# Patient Record
Sex: Male | Born: 1995 | Race: Black or African American | Hispanic: No | Marital: Single | Smoking: Current some day smoker
Health system: Southern US, Community
[De-identification: ages and names within clinical notes are randomized; demographics above are authoritative.]

---

## 2013-09-10 ENCOUNTER — Emergency Department (HOSPITAL_COMMUNITY)
Admission: EM | Admit: 2013-09-10 | Discharge: 2013-09-10 | Disposition: A | Discharge status: Home / Self Care | Payer: 59 | Attending: Emergency Medicine | Admitting: Emergency Medicine

## 2013-09-10 ENCOUNTER — Encounter (HOSPITAL_COMMUNITY): Payer: Self-pay | Admitting: Emergency Medicine

## 2013-09-10 ENCOUNTER — Emergency Department (HOSPITAL_COMMUNITY): Payer: 59

## 2013-09-10 DIAGNOSIS — F17200 Nicotine dependence, unspecified, uncomplicated: Secondary | ICD-10-CM | POA: Insufficient documentation

## 2013-09-10 DIAGNOSIS — J069 Acute upper respiratory infection, unspecified: Secondary | ICD-10-CM

## 2013-09-10 MED ORDER — ACETAMINOPHEN 325 MG PO TABS
650.0000 mg | ORAL_TABLET | Freq: Once | ORAL | Status: AC
  Administered 2013-09-10: 650 mg via ORAL
  Filled 2013-09-10: qty 2

## 2013-09-10 MED ORDER — GUAIFENESIN-DM 100-10 MG/5ML PO SYRP: 5.0000 mL | ORAL_SOLUTION | ORAL | Status: DC | PRN

## 2013-09-10 MED ORDER — ALBUTEROL SULFATE HFA 108 (90 BASE) MCG/ACT IN AERS
2.0000 | INHALATION_SPRAY | Freq: Once | RESPIRATORY_TRACT | Status: AC
  Administered 2013-09-10: 2 via RESPIRATORY_TRACT
  Filled 2013-09-10: qty 6.7

## 2013-09-10 NOTE — ED Notes (Signed)
Patient states has had nasal and chest congestion for a week and 1/2.   Patient states he started out with sore throat, but it has resolved.

## 2013-09-10 NOTE — ED Provider Notes (Signed)
History/physical exam/procedure(s) were performed by non-physician practitioner and as supervising physician I was immediately available for consultation/collaboration. I have reviewed all notes and am in agreement with care and plan.   Hilario Quarryanielle S Elainna Eshleman, MD 09/10/13 [phone]

## 2013-09-10 NOTE — Discharge Instructions (Signed)
Take 2 puffs every 4-6 hours Use cough medication as needed  Drink plenty of fluids and rest  Return to the emergency department if you develop any changing/worsening condition, repeated vomiting, fever, difficulty breathing, coughing up blood, or any other concerns (please read additional information regarding your condition below)'  Cough, Adult  A cough is a reflex that helps clear your throat and airways. It can help heal the body or may be a reaction to an irritated airway. A cough may only last 2 or 3 weeks (acute) or may last more than 8 weeks (chronic).  CAUSES Acute cough:  Viral or bacterial infections. Chronic cough:  Infections.  Allergies.  Asthma.  Post-nasal drip.  Smoking.  Heartburn or acid reflux.  Some medicines.  Chronic lung problems (COPD).  Cancer. SYMPTOMS   Cough.  Fever.  Chest pain.  Increased breathing rate.  High-pitched whistling sound when breathing (wheezing).  Colored mucus that you cough up (sputum). TREATMENT   A bacterial cough may be treated with antibiotic medicine.  A viral cough must run its course and will not respond to antibiotics.  Your caregiver may recommend other treatments if you have a chronic cough. HOME CARE INSTRUCTIONS   Only take over-the-counter or prescription medicines for pain, discomfort, or fever as directed by your caregiver. Use cough suppressants only as directed by your caregiver.  Use a cold steam vaporizer or humidifier in your bedroom or home to help loosen secretions.  Sleep in a semi-upright position if your cough is worse at night.  Rest as needed.  Stop smoking if you smoke. SEEK IMMEDIATE MEDICAL CARE IF:   You have pus in your sputum.  Your cough starts to worsen.  You cannot control your cough with suppressants and are losing sleep.  You begin coughing up blood.  You have difficulty breathing.  You develop pain which is getting worse or is uncontrolled with  medicine.  You have a fever. MAKE SURE YOU:   Understand these instructions.  Will watch your condition.  Will get help right away if you are not doing well or get worse. Document Released: 12/11/2010 Document Revised: 09/06/2011 Document Reviewed: 12/11/2010 [facility] Patient Information 2014 [place], [place].  Upper Respiratory Infection, Adult An upper respiratory infection (URI) is also known as the common cold. It is often caused by a type of germ (virus). Colds are easily spread (contagious). You can pass it to others by kissing, coughing, sneezing, or drinking out of the same glass. Usually, you get better in 1 or 2 weeks.  HOME CARE   Only take medicine as told by your doctor.  Use a warm mist humidifier or breathe in steam from a hot shower.  Drink enough water and fluids to keep your pee (urine) clear or pale yellow.  Get plenty of rest.  Return to work when your temperature is back to normal or as told by your doctor. You may use a face mask and wash your hands to stop your cold from spreading. GET HELP RIGHT AWAY IF:   After the first few days, you feel you are getting worse.  You have questions about your medicine.  You have chills, shortness of breath, or brown or red spit (mucus).  You have yellow or brown snot (nasal discharge) or pain in the face, especially when you bend forward.  You have a fever, puffy (swollen) neck, pain when you swallow, or white spots in the back of your throat.  You have a bad headache, ear  pain, sinus pain, or chest pain.  You have a high-pitched whistling sound when you breathe in and out (wheezing).  You have a lasting cough or cough up blood.  You have sore muscles or a stiff neck. MAKE SURE YOU:   Understand these instructions.  Will watch your condition.  Will get help right away if you are not doing well or get worse. Document Released: 12/01/2007 Document Revised: 09/06/2011 Document Reviewed: 10/19/2010 [facility]  Patient Information 2014 [place], [place].   Emergency Department Resource Guide 1) Find a Doctor and Pay Out of Pocket Although you won't have to find out who is covered by your insurance plan, it is a good idea to ask around and get recommendations. You will then need to call the office and see if the doctor you have chosen will accept you as a new patient and what types of options they offer for patients who are self-pay. Some doctors offer discounts or will set up payment plans for their patients who do not have insurance, but you will need to ask so you aren't surprised when you get to your appointment.  2) Contact Your Local Health Department Not all health departments have doctors that can see patients for sick visits, but many do, so it is worth a call to see if yours does. If you don't know where your local health department is, you can check in your phone book. The CDC also has a tool to help you locate your state's health department, and many state websites also have listings of all of their local health departments.  3) Find a Walk-in Clinic If your illness is not likely to be very severe or complicated, you may want to try a walk in clinic. These are popping up all over the country in pharmacies, drugstores, and shopping centers. They're usually staffed by nurse practitioners or physician assistants that have been trained to treat common illnesses and complaints. They're usually fairly quick and inexpensive. However, if you have serious medical issues or chronic medical problems, these are probably not your best option.  No Primary Care Doctor: - Call Health Connect at  [phone] - they can help you locate a primary care doctor that  accepts your insurance, provides certain services, etc. - Physician Referral Service- [phone]  Chronic Pain Problems: Organization         Address  Phone   Notes  [facility]  [phone] Patients need to be referred by their  primary care doctor.   Medication Assistance: Organization         Address  Phone   Notes  [facility] Medication [facility] [address] [place] [place], [place] [address] [phone] --Must be a resident of [facility] -- Must have NO insurance coverage whatsoever (no Medicaid/ Medicare, etc.) -- The pt. MUST have a primary care doctor that directs their care regularly and follows them in the community   Med[facility]ist  [phone]   [employer]  [phone]    Agencies that provide inexpensive medical care: Organization         Address  Phone   Notes  [facility]  [phone]   [facility]    [phone]   [facility] [address] [place], [place] [address] [phone]   [facility] [address], [place] [phone]   Planned Parenthood    [phone]   [facility]    [phone]   [facility]  [address], [place] Phone:  [phone], Fax:  [phone] Hours of Operation:  9 am - 6 pm, M-F.  Also accepts Medicaid/Medicare and self-pay.  [facility]  [address], [place] Phone: [phone]) [phone], Fax: [phone]. Hours of Operation:  8:30 am - 5:30 pm, M-F.  Also accepts Medicaid and self-pay.  [facility] [place] [address], [place] Phone: [phone](336) [phone]   [facility] [address], [place], [place] [phone], Ext. [phone] Mondays & Thursdays: 7-9 AM.  First 15 patients are seen on a first come, first serve basis.    Medicaid-accepting [facility] Providers:  Organization         Address  Phone   Notes  [facility] [address], [place] [phone] Also accepts self-pay patients.  [facility] [address], [place]  [phone]) [phone]   [facility] [address], [place] [phone]   [facility] [address] [place] [address], [place] [phone]) [phone]   Renaye RakersVeita Bland [address], [place]   [phone] Only accepts [place] Access [place] patients after they have their name applied to their card.   Self-Pay (no insurance) in [facility]:  Organization         Address  Phone   Notes  Sickle Cell Patients, [facility] [address], [place] [phone]) [phone]   [facility] [address], [place] [phone]   [facility]  [address], [place] [phone]   [facility]/Dr. Osei-Bonsu  [address] [place] [address], [place] or [phone], [place] [phone] Phone number for both [place] and [place] locations is the same.  [facility] [address], [place] [phone]   [facility] [address] [place] [address], [place] or [address] [phone]) [phone]   [facility] [address], [place] [phone], phone; [phone]) [phone], fax Sees patients 1st and 3rd Saturday of every month.  Must not qualify for public or private insurance (i.e. Medicaid, Medicare, [place] Health Choice, Veterans' Benefits)  Household income should be no more than 200% of the poverty level The clinic cannot treat you if you are pregnant or think you are pregnant  Sexually transmitted diseases are not treated at the clinic.    Dental Care: Organization         Address  Phone  Notes  [facility] Department of [facility] [address], [place] [phone] Accepts children up to age 20 who are enrolled in [place] or [place] Health Choice; pregnant women with a Medicaid card; and children who have applied for Medicaid or [place] Health Choice, but were declined, whose parents can pay a reduced fee  at time of service.  [facility] of [facility] [place]  [address], [place] [phone] Accepts children up to age 20 who are enrolled in [place] or [place] Health Choice; pregnant women with a Medicaid card; and children who have applied for Medicaid or [place] Health Choice, but were declined, whose parents can pay a reduced fee at time of  service.  [facility]  [address] [place], [place] [phone] Patients are seen by appointment only. Walk-ins are not accepted. [facility] will see patients 36 years of age and older. Monday - Tuesday (8am-5pm) Most Wednesdays (8:30-5pm) $30 per visit, cash only  [facility] Access [facility]  [address], [facility] [phone] Patients are seen by appointment only. Walk-ins are not accepted. [facility] will see patients 58 years of age and older. One Wednesday Evening (Monthly: Volunteer Based).  $30 per visit, cash only  [employer]  [phone] for adults; Children under age 40, call Graduate Pediatric Dentistry at [phone]. Children aged 69-14, please call [phone] to request a pediatric application.  Dental services are provided in all areas of dental care including fillings, crowns and bridges, complete and partial dentures, implants, gum treatment, root canals, and extractions. Preventive care is also provided. Treatment is provided to both adults and children. Patients are selected via a lottery and there is often a waiting list.   [facility] [address], [place]  [phone] [email or web address]   [facility] [address] [place], [place] [phone], Ext. [phone] Second and Fourth Thursday of each month, opens at 6:30 AM; Clinic ends at 9 AM.  Patients are seen on a first-come first-served basis, and a limited number are seen during each clinic.   [facility]  [address] [place], [place] [phone]   Eligibility Requirements You must have lived in [place], [place], or [place] counties for at least the last three months.   You cannot be eligible for state or federal sponsored [place], including [employer], [place], or [employer].   You generally cannot be eligible for healthcare insurance through your employer.    How to apply: Eligibility screenings are held every Tuesday and Wednesday afternoon from 1:00 pm until 4:00 pm. You do not need an appointment for the interview!  [facility] [address] [place], [place] [phone]   [facility] Health Department  [phone]   [facility] Health Department  [phone]   [facility] Health Department  [phone]    Behavioral Health Resources in the Community: Intensive Outpatient Programs Organization         Address  Phone  Notes  [facility] [address], [place], [place] [phone]   [facility] [address], [place], [place] [phone]   [facility]: [facility] Drug [facility] [address], [place], [place]  [phone]   [facility] Mental Health [address]  [place], [place] [phone] or [phone]   Substance Abuse Resources Organization         Address  Phone  Notes  Alcohol [facility] Drug Services  [phone]   [facility]  [phone]   [facility] [place]  [phone]   Floydene Flock  [phone]   Residential & Outpatient Substance Abuse Program  [phone]   Psychological Services Organization         Address  Phone  Notes  [facility]  [phone]   [facility]  [phone]   [facility] [address], [place] [phone] or [phone]    Mobile Crisis Teams Organization         Address  Phone  Notes  [facility], Mobile Crisis Care Unit  [phone]   Assertive Psychotherapeutic  Services  [address] [place], [place] [phone]   [facility] [address] [place] [phone]    Self-Help/Support Groups Organization         Address  Phone             Notes  [facility] [place] - variety of support groups  [phone]- [phone] Call for more information  Narcotics Anonymous (NA), [facility] [address], [employer] [place]  2 meetings at this location   Statistician         Address  Phone  Notes  [facility] [address],    [place]  [phone]   [facility]  [address] [place] [address], [place], [place] [phone]   [facility] [address] [place], [place] [phone] Admissions: 8am-3pm M-F  [facility] [address],    [place], [place] [phone]   [facility] [address] [place], [place], [place] [phone]   [facility] [address]  [place], [place] [phone]   [facility] - Intensive Outpatient [address], [place], [place] [phone]   [facility] ([facility] [address]  [place], [place] [phone] or [phone]   [facility] (RT[facility]) [address] [place], [place] [phone] Accepts Medicaid  [facility] [address],  [place] [phone] Substance Abuse/Addiction Treatment   [facility] Organization         Address  Phone  Notes  [facility]  [phone]   Angie Fava, PhD [address]vin Kn[address] [place], [place]   [phone] or [phone]   [facility]   [address] [place], [place] [phone]   [facility] [address], [place], [place] [phone] Insurance/Medicaid/sponsorship through [facility] [address]                                    [place], [place] [phone] Therapy/tele-psych/case  [facility] [address], [place] [phone]    Dr. Lolly Mustache  [phone]   [facility]  [employer] [facility] Dept. 1) [address] [place] 2) [address], [place] 3)  [address], [place] [phone]  [phone]   [facility] Child Abuse Hotline (551) 620-2872 or [phone] (After Hours)

## 2013-09-10 NOTE — ED Provider Notes (Signed)
CSN: [id number]     Arrival date & time 09/10/13  3086 History  This chart was scribed for non-physician practitioner, Coral Ceo, PA-C working with Hilario Quarry, MD by Greggory Stallion, ED scribe. This patient was seen in room TR07C/TR07C and the patient's care was started at 9:12 AM.   Chief Complaint  Patient presents with  . Nasal Congestion  . chest congestion    The history is provided by the patient. No language interpreter was used.   HPI Comments: Douglas Rhodes is a 18 y.o. male who presents to the Emergency Department complaining of nasal congestion, rhinorrhea, sinus pressure, chest congestion and productive cough of light green sputum that started 1.5 weeks ago. He states his symptoms started with a sore throat but it has resolved. Pt has taken mucinex, sudafed and benadryl with little relief. Pt now has a slight headache but denies any other pain. Denies fever, hemoptysis, wheezing, SOB, chest pain, abdominal pain, nausea, emesis, diarrhea. Denies sick contacts. Pt smokes cigarettes sometimes. Denies any PMH.   No past medical history on file. No past surgical history on file. No family history on file. History  Substance Use Topics  . Smoking status: Not on file  . Smokeless tobacco: Not on file  . Alcohol Use: Not on file    Review of Systems  Constitutional: Negative for fever.  HENT: Positive for congestion, rhinorrhea and sinus pressure. Negative for sore throat.   Respiratory: Positive for cough. Negative for shortness of breath and wheezing.   Cardiovascular: Negative for chest pain.  Gastrointestinal: Negative for nausea, vomiting, abdominal pain and diarrhea.  Neurological: Positive for headaches.  All other systems reviewed and are negative.   Allergies  Review of patient's allergies indicates not on file.  Home Medications  No current outpatient prescriptions on file.  BP 120/74  Pulse 93  Temp(Src) 98.1 F (36.7 C) (Oral)  Resp 18  Ht 6\' 1"   (1.854 m)  Wt 156 lb (70.761 kg)  BMI 20.59 kg/m2  SpO2 100%  Filed Vitals:   09/10/13 0846  BP: 120/74  Pulse: 93  Temp: 98.1 F (36.7 C)  TempSrc: Oral  Resp: 18  Height: 6\' 1"  (1.854 m)  Weight: 156 lb (70.761 kg)  SpO2: 100%     Physical Exam  Nursing note and vitals reviewed. Constitutional: He is oriented to person, place, and time. He appears well-developed and well-nourished. No distress.  HENT:  Head: Normocephalic and atraumatic.  Right Ear: Tympanic membrane, external ear and ear canal normal.  Left Ear: Tympanic membrane, external ear and ear canal normal.  Nose: Nose normal.  Mouth/Throat: Oropharynx is clear and moist. No oropharyngeal exudate.  Tympanic membranes gray and translucent bilaterally with no erythema, edema, or hemotympanum.  No mastoid or tragal tenderness bilaterally. No erythema to the posterior pharynx. Tonsils without edema or exudates. Uvula midline. No trismus. No difficulty controlling secretions. No sinus tenderness throughout.   Eyes: Conjunctivae and EOM are normal. Pupils are equal, round, and reactive to light. Right eye exhibits no discharge. Left eye exhibits no discharge.  Neck: Normal range of motion. Neck supple. No tracheal deviation present.  No cervical spinal or paraspinal tenderness to palpation throughout.  No limitations with neck ROM.  No cervical lymphadenopathy. No nuchal rigidity.   Cardiovascular: Normal rate, regular rhythm and normal heart sounds.  Exam reveals no gallop and no friction rub.   No murmur heard. Pulmonary/Chest: Effort normal. No respiratory distress. He has wheezes. He has no rhonchi.  He has no rales. He exhibits no tenderness.  Intermittent expiratory wheeze throughout  Abdominal: Soft. He exhibits no distension. There is no tenderness.  Musculoskeletal: Normal range of motion. He exhibits no edema and no tenderness.  No LE edema or calf tenderness bilaterally  Neurological: He is alert and oriented to  person, place, and time.  Skin: Skin is warm and dry. He is not diaphoretic.  Psychiatric: He has a normal mood and affect. His behavior is normal.    ED Course  Procedures (including critical care time)  DIAGNOSTIC STUDIES: Oxygen Saturation is 100% on RA, normal by my interpretation.    COORDINATION OF CARE: 9:15 AM-Discussed treatment plan which includes chest xray and tylenol with pt at bedside and pt agreed to plan.   Labs Review Labs Reviewed - No data to display Imaging Review Dg Chest 2 View  09/10/2013   CLINICAL DATA:  Congestion, history of tobacco use  EXAM: CHEST  2 VIEW  COMPARISON:  None.  FINDINGS: The lungs are mildly hyperinflated with hemidiaphragm flattening. There is no focal infiltrate. There is no pleural effusion. The mediastinum is normal in width. The cardiac silhouette is normal in size. The pulmonary vascularity is not engorged. The trachea is midline. There is no pneumothorax or pleural effusion. The observed portions of the bony thorax appear normal.  IMPRESSION: There is hyperinflation consistent with reactive airway disease. There is no evidence of pneumonia.   Electronically Signed   By: David  SwazilandJordan   On: 09/10/2013 09:36     EKG Interpretation None          DG Chest 2 View (Final result)  Result time: 09/10/13 09:36:16    Final result by Rad Results In Interface (09/10/13 09:36:16)    Narrative:   CLINICAL DATA: Congestion, history of tobacco use  EXAM: CHEST 2 VIEW  COMPARISON: None.  FINDINGS: The lungs are mildly hyperinflated with hemidiaphragm flattening. There is no focal infiltrate. There is no pleural effusion. The mediastinum is normal in width. The cardiac silhouette is normal in size. The pulmonary vascularity is not engorged. The trachea is midline. There is no pneumothorax or pleural effusion. The observed portions of the bony thorax appear normal.  IMPRESSION: There is hyperinflation consistent with reactive airway  disease. There is no evidence of pneumonia.   Electronically Signed By: David SwazilandJordan On: 09/10/2013 09:36          MDM   Natale Milcharon Pion is a 18 y.o. male ale who presents to the Emergency Department complaining of nasal congestion, rhinorrhea, sinus pressure, chest congestion and productive cough of light green sputum that started 1.5 weeks ago.   Rechecks  10:00 AM = Patient states he feels better after his albuterol treatment. Lungs clear to auscultation. Headache starting to improve after Tylenol. Asking for work excuse.     Etiology of nasal and chest congestion possibly due to URI vs bronchitis vs viral syndrome. Chest x-ray negative for an acute cardiopulmonary process. Patient had an intermittent wheeze which cleared with one albuterol treatment in the ED. No hypoxia, respiratory distress, or tachypnea. No chest pain, SOB, or dyspnea. Patient encouraged to quit smoking. Patient also provided resources for follow-up with a PCP. Encouraged to rest and drink fluids. Return precautions, discharge instructions, and follow-up was discussed with the patient before discharge.     Discharge Medication List as of 09/10/2013  9:54 AM    START taking these medications   Details  guaiFENesin-dextromethorphan (ROBITUSSIN DM) 100-10 MG/5ML syrup Take  5 mLs by mouth every 4 (four) hours as needed for cough., Starting 09/10/2013, Until Discontinued, Print         Final impressions: 1. URI (upper respiratory infection)       Thomasenia Sales   I personally performed the services described in this documentation, which was scribed in my presence. The recorded information has been reviewed and is accurate.   Jillyn Ledger, PA-C 09/10/13 1024

## 2016-01-15 ENCOUNTER — Encounter (HOSPITAL_COMMUNITY): Payer: Self-pay | Admitting: Emergency Medicine

## 2016-01-15 ENCOUNTER — Emergency Department (HOSPITAL_COMMUNITY)
Admission: EM | Admit: 2016-01-15 | Discharge: 2016-01-15 | Disposition: A | Discharge status: Home / Self Care | Payer: BLUE CROSS/BLUE SHIELD | Attending: Emergency Medicine | Admitting: Emergency Medicine

## 2016-01-15 DIAGNOSIS — R52 Pain, unspecified: Secondary | ICD-10-CM | POA: Diagnosis present

## 2016-01-15 DIAGNOSIS — F17200 Nicotine dependence, unspecified, uncomplicated: Secondary | ICD-10-CM | POA: Insufficient documentation

## 2016-01-15 MED ORDER — IBUPROFEN 400 MG PO TABS
600.0000 mg | ORAL_TABLET | Freq: Once | ORAL | Status: AC
  Administered 2016-01-15: 600 mg via ORAL
  Filled 2016-01-15: qty 1

## 2016-01-15 MED ORDER — IBUPROFEN 600 MG PO TABS: 600.0000 mg | ORAL_TABLET | Freq: Four times a day (QID) | ORAL | Status: DC | PRN

## 2016-01-15 NOTE — ED Provider Notes (Signed)
CSN: [id number]     Arrival date & time 01/15/16  9147 History   First MD Initiated Contact with Patient 01/15/16 [phone]     Chief Complaint  Patient presents with  . Generalized Body Aches   HPI  Douglas Rhodes is an 20 y.o. male with no significant PMH who presents to the ED for evaluation of generalized body aches. He states his symptoms have been presents for the past three weeks. He states he aches intermittently and sometimes it's in his shoulders, in his back, and in his legs. He states he feels the most sore after waking up from sleep. He denies injury or trauma. Denies numbness, weakness, or tingling. He has not tried anything to alleviate his symptoms. Denies fam hx of any conditions. Denies chest pain or SOB. Denies URI symptoms. He does note he has been feeling very drained lately. He started summer courses seven weeks ago in addition to working a physically demanding job with [employer].  History reviewed. No pertinent past medical history. History reviewed. No pertinent past surgical history. History reviewed. No pertinent family history. Social History  Substance Use Topics  . Smoking status: Current Some Day Smoker  . Smokeless tobacco: None  . Alcohol Use: Yes    Review of Systems  All other systems reviewed and are negative.     Allergies  Shrimp  Home Medications   Prior to Admission medications   Medication Sig Start Date End Date Taking? Authorizing Provider  diphenhydrAMINE (BENADRYL) 25 MG tablet Take 50 mg by mouth at bedtime as needed for allergies.    Historical Provider, MD  ferrous sulfate 325 (65 FE) MG tablet Take 325 mg by mouth daily with breakfast.    Historical Provider, MD  guaiFENesin-dextromethorphan (ROBITUSSIN DM) 100-10 MG/5ML syrup Take 5 mLs by mouth every 4 (four) hours as needed for cough. 09/10/13   Jillyn Ledger, PA-C  Multiple Vitamin (MULTIVITAMIN WITH MINERALS) TABS tablet Take 1 tablet by mouth daily.    Historical Provider, MD   BP  118/79 mmHg  Pulse 72  Temp(Src) 98.4 F (36.9 C) (Oral)  Resp 18  SpO2 99% Physical Exam  Constitutional: He is oriented to person, place, and time. No distress.  HENT:  Head: Atraumatic.  Right Ear: External ear normal.  Left Ear: External ear normal.  Nose: Nose normal.  Eyes: Conjunctivae are normal. No scleral icterus.  Cardiovascular: Normal rate and regular rhythm.   Pulmonary/Chest: Effort normal. No respiratory distress.  Abdominal: He exhibits no distension.  Musculoskeletal:  No c-spine, t-spine, or l-spine tenderness. No stepoff or deformity. No upper or lower extremity tenderness. Moves all extremities freely. Strength intact throughout. Steady gait.  Neurological: He is alert and oriented to person, place, and time.  Skin: Skin is warm and dry. He is not diaphoretic.  Psychiatric: He has a normal mood and affect. His behavior is normal.  Nursing note and vitals reviewed.   ED Course  Procedures (including critical care time) Labs Review Labs Reviewed - No data to display  Imaging Review No results found. I have personally reviewed and evaluated these images and lab results as part of my medical decision-making.   EKG Interpretation None      MDM   Final diagnoses:  Body aches    Exam is reassuring. Pt is well appearing. VS unremarkable. No neuro deficits. No indication for further emergent workup or imaging at this time. Will d/c home with ibuprofen. [facility] and resources  given to establish PCP for follow up. ER return precautions given.    Carlene CoriaSerena Y Cyanne Delmar, PA-C 01/15/16 1026  Maia PlanJoshua G Long, MD 01/16/16 [phone]

## 2016-01-15 NOTE — ED Notes (Signed)
Patient states achy joints x 2 weeks.  "It moves around.  Sometimes its my shoulders, sometimes my knees".   Patient states hasn't taken anything for pain.

## 2016-01-15 NOTE — ED Notes (Signed)
Pt sts generalized body aches in legs and shoulders x 2 weeks

## 2016-01-15 NOTE — Discharge Instructions (Signed)

## 2019-05-14 ENCOUNTER — Emergency Department (HOSPITAL_COMMUNITY)
Admission: EM | Admit: 2019-05-14 | Discharge: 2019-05-14 | Disposition: A | Discharge status: Home / Self Care | Payer: BLUE CROSS/BLUE SHIELD | Attending: Emergency Medicine | Admitting: Emergency Medicine

## 2019-05-14 ENCOUNTER — Other Ambulatory Visit: Payer: Self-pay

## 2019-05-14 ENCOUNTER — Emergency Department (HOSPITAL_COMMUNITY): Payer: BLUE CROSS/BLUE SHIELD

## 2019-05-14 ENCOUNTER — Encounter (HOSPITAL_COMMUNITY): Payer: Self-pay

## 2019-05-14 DIAGNOSIS — F17210 Nicotine dependence, cigarettes, uncomplicated: Secondary | ICD-10-CM | POA: Diagnosis not present

## 2019-05-14 DIAGNOSIS — R0789 Other chest pain: Secondary | ICD-10-CM | POA: Diagnosis present

## 2019-05-14 LAB — BASIC METABOLIC PANEL
Anion gap: 10 (ref 5–15)
BUN: 6 mg/dL (ref 6–20)
CO2: 26 mmol/L (ref 22–32)
Calcium: 9.3 mg/dL (ref 8.9–10.3)
Chloride: 103 mmol/L (ref 98–111)
Creatinine, Ser: 0.94 mg/dL (ref 0.61–1.24)
GFR calc Af Amer: >60 mL/min (ref >60)
GFR calc non Af Amer: >60 mL/min (ref >60)
Glucose, Bld: 87 mg/dL (ref 70–99)
Potassium: 3.7 mmol/L (ref 3.5–5.1)
Sodium: 139 mmol/L (ref 135–145)

## 2019-05-14 LAB — TROPONIN I (HIGH SENSITIVITY)
Troponin I (High Sensitivity): <2 ng/L (ref <18)
Troponin I (High Sensitivity): 2 ng/L (ref <18)

## 2019-05-14 LAB — CBC
HCT: 43.7 % (ref 39.0–52.0)
Hemoglobin: 15.1 g/dL (ref 13.0–17.0)
MCH: 31.9 pg (ref 26.0–34.0)
MCHC: 34.6 g/dL (ref 30.0–36.0)
MCV: 92.2 fL (ref 80.0–100.0)
Platelets: 152 10*3/uL (ref 150–400)
RBC: 4.74 MIL/uL (ref 4.22–5.81)
RDW: 11.9 % (ref 11.5–15.5)
WBC: 4.3 10*3/uL (ref 4.0–10.5)
nRBC: 0 % (ref 0.0–0.2)

## 2019-05-14 MED ORDER — IBUPROFEN 400 MG PO TABS
600.0000 mg | ORAL_TABLET | Freq: Once | ORAL | Status: AC
  Administered 2019-05-14: 600 mg via ORAL
  Filled 2019-05-14: qty 1

## 2019-05-14 MED ORDER — LIDOCAINE 5 % EX PTCH
1.0000 | MEDICATED_PATCH | CUTANEOUS | Status: DC
  Administered 2019-05-14: 1 via TRANSDERMAL
  Filled 2019-05-14: qty 1

## 2019-05-14 NOTE — Discharge Instructions (Signed)
You were evaluated in the emergency department for chest pain.    Your work up was completely normal including heart enzymes, chest x-ray, EKG, labs.    Based on your risk factors, work up and exam you are considered very low risk for major adverse cardiac events in the next 30 days.  This means you can be discharged with close follow up with follow up with primary care doctor for further outpatient work up as needed, if your chest discomfort continues  Call your primary care doctor as soon as possible to establish care and further discussion and work up of your symptoms on an outpatient setting  Take 600 mg ibuprofen every 8 hours for pain.  Apply over the counter lidocaine patch to the area every 12 hours.   Please return to ED if: Your chest pain is worse or on exertion You have a cough that gets worse, or you cough up blood. You have severe pain in chest, back or abdomen. You have chest pain with loss of sensation, weakness or tingling in extremities You have chest pain or shortness of breath with exertion or activity You have sudden, unexplained discomfort in your chest, with radiation arms, back, neck, or jaw. You suddenly have chest pain and begin to sweat, or your skin gets clammy. You feel chest pain with nausea or vomiting, blood in vomit You suddenly feel light-headed or faint. Your heart begins to beat quickly, or it feels like it is skipping beats. You have one sided leg swelling or calf pain You have chest pain with fever, chills, cough or other viral symptoms

## 2019-05-14 NOTE — ED Triage Notes (Signed)
Pt endorses left sided chest pain, no radiation or other sx. VSS.

## 2019-05-14 NOTE — ED Provider Notes (Signed)
[facility] EMERGENCY DEPARTMENT Provider Note   CSN: [id number] Arrival date & time: 05/14/19  1038     History   Chief Complaint Chief Complaint  Patient presents with  . Chest Pain    HPI Douglas Rhodes is a 23 y.o. male with no known past medical history here for evaluation of chest pain.  Onset while he was sitting down last night around 8 PM.  Described as sharp.  Localized to the left chest below nipple area.  Pain is intermittent.  It sometimes hurts when he breathes in and changes positions and moves but not always.  Had slight nausea yesterday with it but resolved after he had a snack.  Smokes marijuana every other day.  No tobacco use.  No recent illnesses.  No recent exercise or lifting activity to cause muscular pain.  No other associate symptoms such as fever, congestion, sore throat, cough, shortness of breath.  No palpitations, lightheadedness or syncope.  No lower extremity edema or calf pain.  No previous history of DVT or PE, recent surgery or immobilization, hormone therapy, cancer.  No personal history of congenital heart disease or CAD.  No family history of CAD.  His grandfather had diabetes.  No interventions.      HPI  History reviewed. No pertinent past medical history.  There are no active problems to display for this patient.   History reviewed. No pertinent surgical history.      Home Medications    Prior to Admission medications   Not on File    Family History History reviewed. No pertinent family history.  Social History Social History   Tobacco Use  . Smoking status: Current Some Day Smoker  Substance Use Topics  . Alcohol use: Yes  . Drug use: Yes    Types: Marijuana     Allergies   Shrimp [shellfish allergy]   Review of Systems Review of Systems  Cardiovascular: Positive for chest pain.  All other systems reviewed and are negative.    Physical Exam Updated Vital Signs BP 126/88 (BP Location: Right  Arm)   Pulse 65   Temp 98.3 F (36.8 C) (Oral)   Resp 14   Ht 6\' 2"  (1.88 m)   Wt 72.6 kg   SpO2 100%   BMI 20.54 kg/m   Physical Exam Constitutional:      Appearance: He is well-developed.     Comments: NAD. Non toxic.   HENT:     Head: Normocephalic and atraumatic.     Nose: Nose normal.  Eyes:     General: Lids are normal.     Conjunctiva/sclera: Conjunctivae normal.  Neck:     Musculoskeletal: Normal range of motion.     Trachea: Trachea normal.     Comments: Trachea midline.  Cardiovascular:     Rate and Rhythm: Normal rate and regular rhythm.     Pulses:          Radial pulses are 2+ on the right side and 2+ on the left side.       Dorsalis pedis pulses are 2+ on the right side and 2+ on the left side.     Heart sounds: Normal heart sounds, S1 normal and S2 normal.     Comments: No LE edema or calf tenderness.  Pulmonary:     Effort: Pulmonary effort is normal.     Breath sounds: Normal breath sounds.  Chest:       Comments: Mild reproducible chest wall tenderness  as above.  Skin is normal in the A/P/lateral thorax without lesions or rash.  No pain noted with deep inspiration during exam. Abdominal:     General: Bowel sounds are normal.     Palpations: Abdomen is soft.     Tenderness: There is no abdominal tenderness.     Comments: No epigastric tenderness. No distention.   Skin:    General: Skin is warm and dry.     Capillary Refill: Capillary refill takes less than 2 seconds.     Comments: No rash to chest wall  Neurological:     Mental Status: He is alert.     GCS: GCS eye subscore is 4. GCS verbal subscore is 5. GCS motor subscore is 6.     Comments: Sensation and strength intact in upper and lower extremities.  Psychiatric:        Speech: Speech normal.        Behavior: Behavior normal.        Thought Content: Thought content normal.      ED Treatments / Results  Labs (all labs ordered are listed, but only abnormal results are displayed) Labs  Reviewed  BASIC METABOLIC PANEL  CBC  TROPONIN I (HIGH SENSITIVITY)  TROPONIN I (HIGH SENSITIVITY)    EKG None  Radiology Dg Chest 2 View  Result Date: 05/14/2019 CLINICAL DATA:  Patient reports sharp stabbing pains under left breast since yesterday. Pressure in chest with inhalation. Denies any cough. Denies any prior heart or lung conditions or surgeries. Patient reports non smoker. EXAM: CHEST - 2 VIEW COMPARISON:  09/10/2013 FINDINGS: The heart size and mediastinal contours are within normal limits. Both lungs are clear. The visualized skeletal structures are unremarkable. IMPRESSION: No active cardiopulmonary disease. Electronically Signed   By: Kathreen Devoid   On: 05/14/2019 12:12    Procedures Procedures (including critical care time)  Medications Ordered in ED Medications  lidocaine (LIDODERM) 5 % 1 patch (1 patch Transdermal Patch Applied 05/14/19 1324)  ibuprofen (ADVIL) tablet 600 mg (600 mg Oral Given 05/14/19 1323)     Initial Impression / Assessment and Plan / ED Course  I have reviewed the triage vital signs and the nursing notes.  Pertinent labs & imaging results that were available during my care of the patient were reviewed by me and considered in my medical decision making (see chart for details).  Pt is a 23 y.o. male presents with what sounds like atypical CP.   CP onset at 8 pm last night while at rest. CP free in ER.  Symptoms described as sharp, non exertional.  Some pain with breathing and position changes.    No associated concerning features such as fever, cough, SOB. No recent illnesses. No palpitations, light-headedness, syncope, pleuritic pain, leg swelling/calf pain to suggest DVT.    No cardiac risk factors.  No h/o HTN, hypercholesterolemia, DM, obesity, smoking, positive family hx, known CAD.    VS WNL and stable. CV and pulmonary exam benign. There is reproducible CP with palpation and position changes.  No LE edema or calf tenderness. No  epigastric tenderness. No neuro or pulse deficits.   Work up benign. EKG non-ischemic.  Hs-trop x 2 undetectable.  No risk factors for PE/DVT, Wells Score is 0 and PERC negative. Heart score is < 3.    Given symptomatology, exam, non ischemic cardiac work up in ER and HEART score patient is appropriate for discharge with PCP f/u.  Work up not suggestive of symptomatic anemia, PE,  PTX, dissection, ACS.  Likely atypical chest pain, possibly MSK etiology vs pleurisy vs costochondritis. No exposures to COVID or symptoms of COVID.  No recent illnesses, rubs, doubt pericarditis. ED return preacutions given. Pt appears reliable for follow up, aware of symptoms that would warrant return to ER.  Pt is comfortable and agreeable with ER POC and discharge plan.    Final Clinical Impressions(s) / ED Diagnoses   Final diagnoses:  Atypical chest pain    ED Discharge Orders    None       Liberty HandyGibbons, Valena Ivanov J, PA-C 05/14/19 1415    Derwood KaplanNanavati, Ankit, MD 05/15/19 [phone]

## 2020-10-19 IMAGING — DX DG CHEST 2V
2 series · 2 of 2 positions shown · non-contrast
Comparison: 09/10/2013

CLINICAL DATA: Patient reports sharp stabbing pains under left
breast since yesterday. Pressure in chest with inhalation. Denies
any cough. Denies any prior heart or lung conditions or surgeries.
Patient reports non smoker.

EXAM:
CHEST - 2 VIEW

[chest pa]
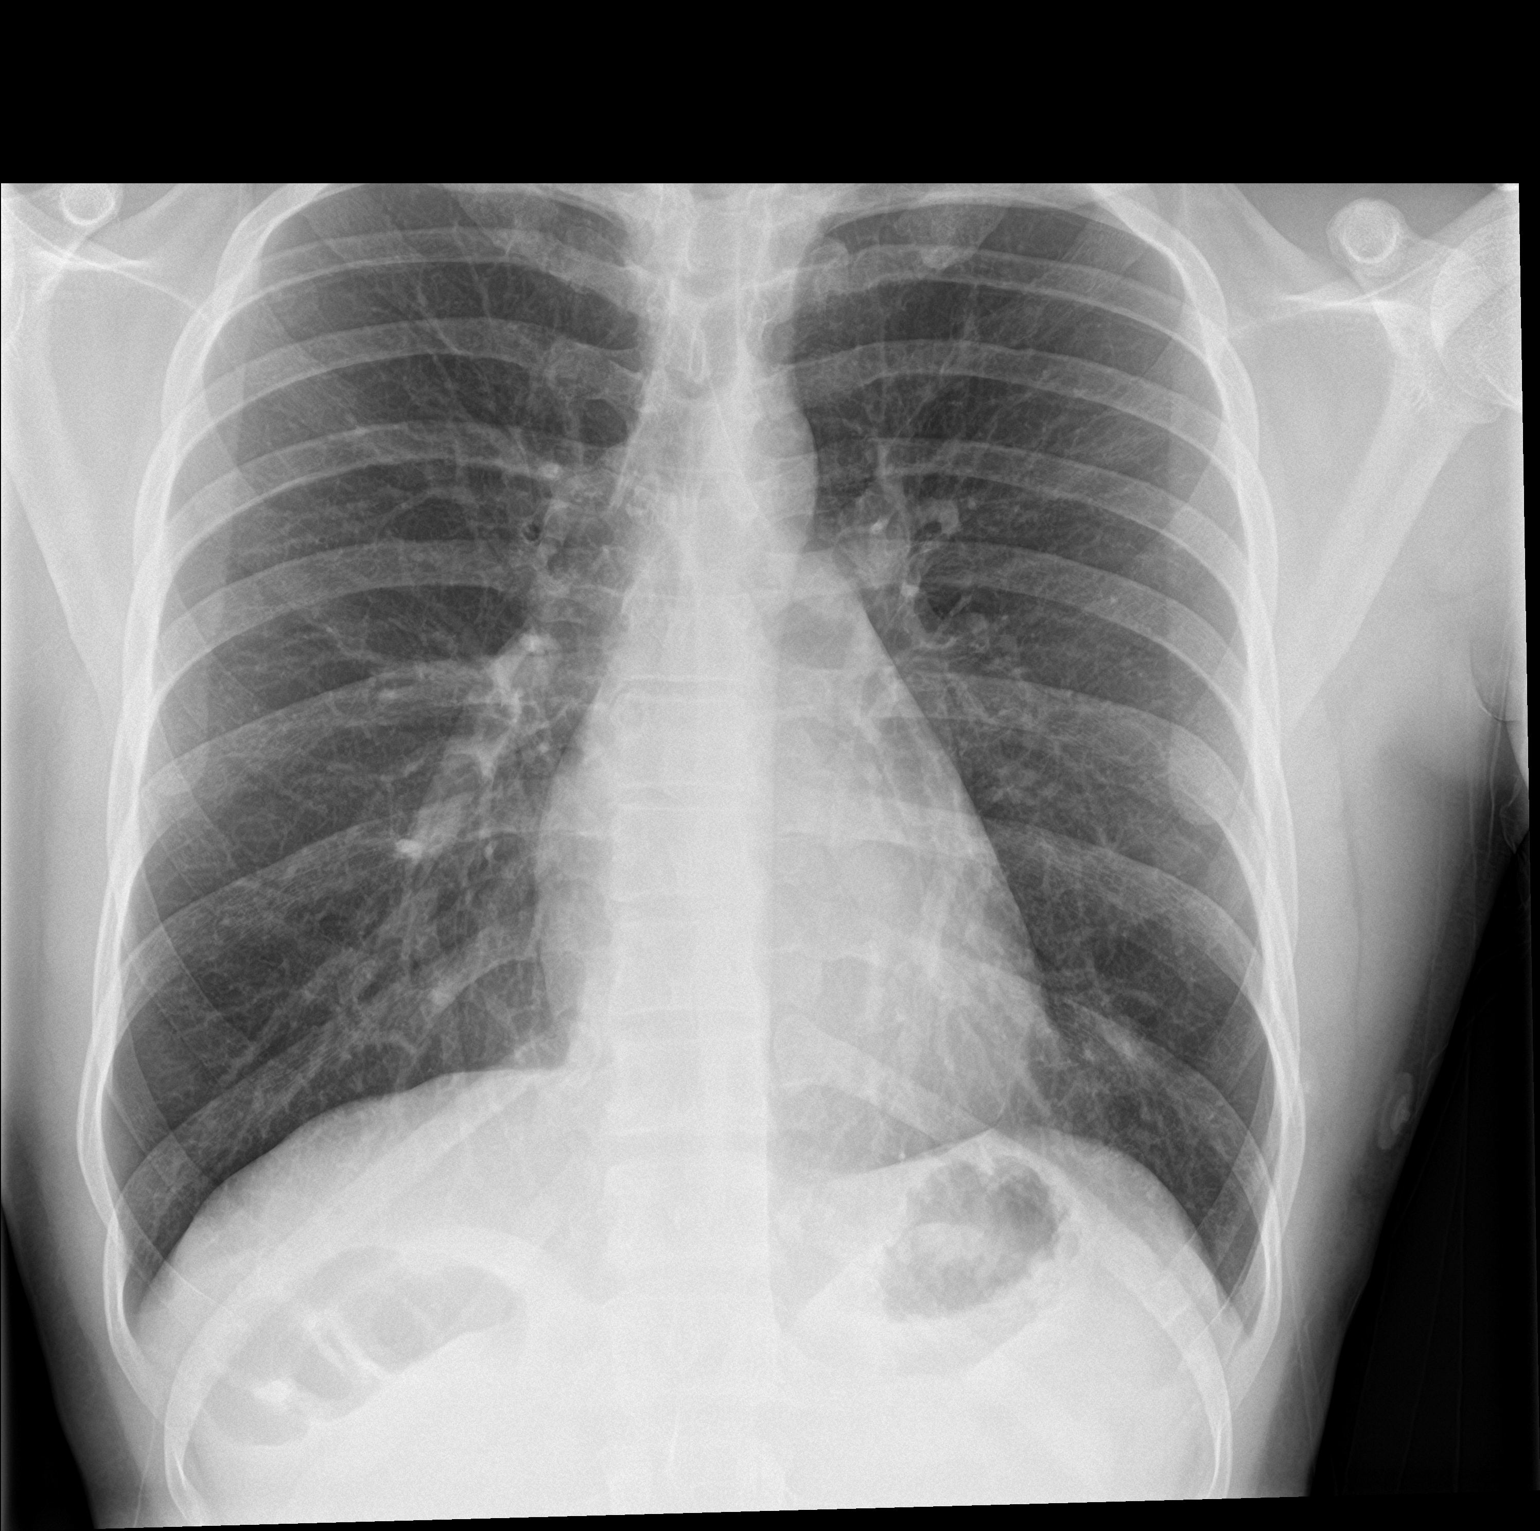

[chest lat]
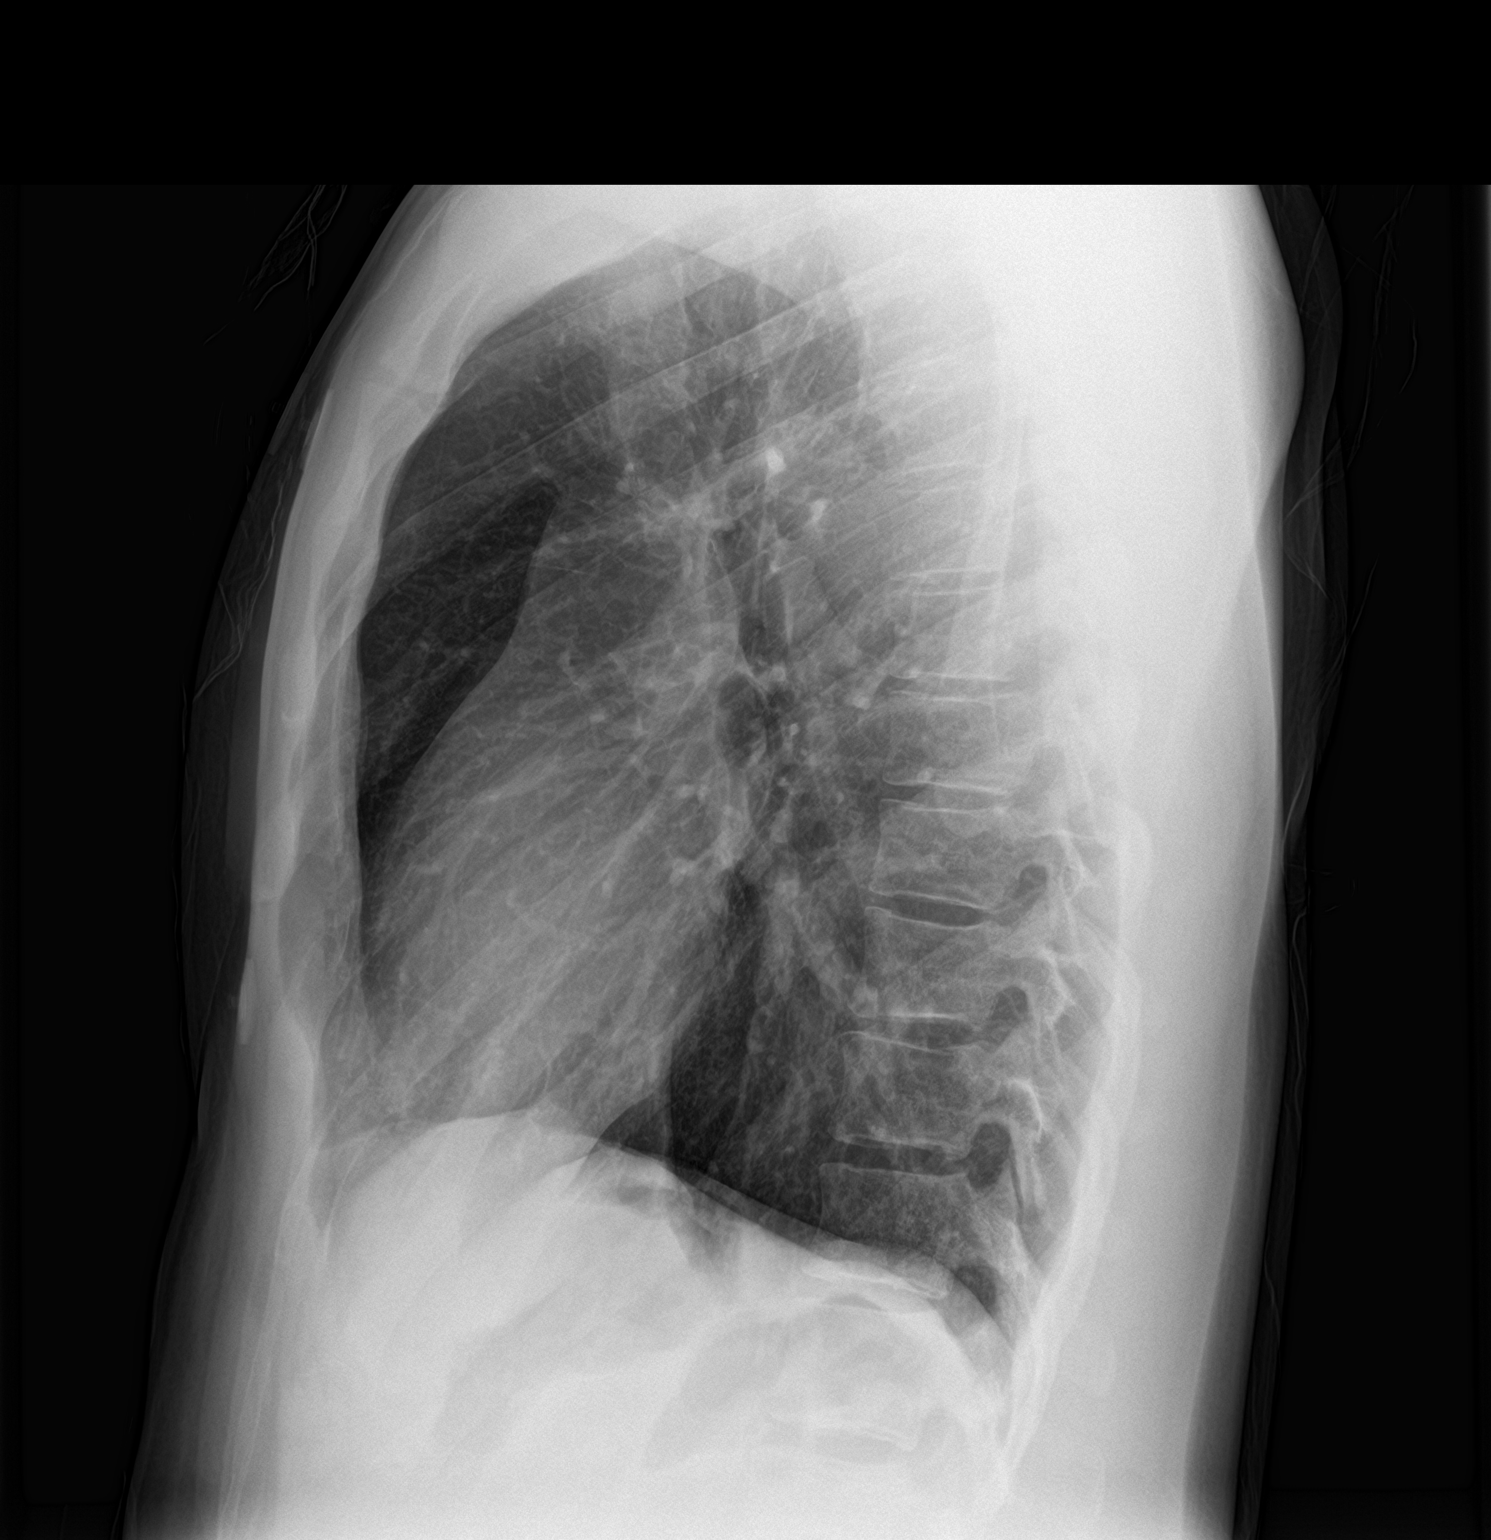

[2 of 2 positions shown; findings below may reference images not displayed]

FINDINGS: The heart size and mediastinal contours are within normal limits.
Both lungs are clear. The visualized skeletal structures are
unremarkable.
IMPRESSION: No active cardiopulmonary disease.
